# Patient Record
Sex: Male | Born: 1955 | Race: White | Hispanic: No | Marital: Married | Smoking: Never smoker
Health system: Southern US, Community
[De-identification: ages and names within clinical notes are randomized; demographics above are authoritative.]

## PROBLEM LIST (undated history)

## (undated) DIAGNOSIS — E119 Type 2 diabetes mellitus without complications: Secondary | ICD-10-CM

## (undated) DIAGNOSIS — I10 Essential (primary) hypertension: Secondary | ICD-10-CM

---

## 2008-12-25 ENCOUNTER — Emergency Department: Payer: Self-pay | Admitting: Emergency Medicine

## 2012-06-13 ENCOUNTER — Ambulatory Visit: Payer: Self-pay

## 2016-09-14 ENCOUNTER — Encounter: Payer: Self-pay | Admitting: Emergency Medicine

## 2016-09-14 ENCOUNTER — Emergency Department: Payer: 59

## 2016-09-14 ENCOUNTER — Emergency Department
Admission: EM | Admit: 2016-09-14 | Discharge: 2016-09-14 | Disposition: A | Discharge status: Home / Self Care | Payer: 59 | Attending: Emergency Medicine | Admitting: Emergency Medicine

## 2016-09-14 DIAGNOSIS — M7989 Other specified soft tissue disorders: Secondary | ICD-10-CM | POA: Insufficient documentation

## 2016-09-14 DIAGNOSIS — Y9389 Activity, other specified: Secondary | ICD-10-CM | POA: Diagnosis not present

## 2016-09-14 DIAGNOSIS — Y92480 Sidewalk as the place of occurrence of the external cause: Secondary | ICD-10-CM | POA: Diagnosis not present

## 2016-09-14 DIAGNOSIS — E119 Type 2 diabetes mellitus without complications: Secondary | ICD-10-CM | POA: Diagnosis not present

## 2016-09-14 DIAGNOSIS — S20211A Contusion of right front wall of thorax, initial encounter: Secondary | ICD-10-CM | POA: Insufficient documentation

## 2016-09-14 DIAGNOSIS — Y999 Unspecified external cause status: Secondary | ICD-10-CM | POA: Insufficient documentation

## 2016-09-14 DIAGNOSIS — I10 Essential (primary) hypertension: Secondary | ICD-10-CM | POA: Insufficient documentation

## 2016-09-14 DIAGNOSIS — S299XXA Unspecified injury of thorax, initial encounter: Secondary | ICD-10-CM | POA: Diagnosis present

## 2016-09-14 HISTORY — DX: Type 2 diabetes mellitus without complications: E11.9

## 2016-09-14 HISTORY — DX: Essential (primary) hypertension: I10

## 2016-09-14 NOTE — Discharge Instructions (Signed)
Follow-up with your primary care doctor if any continued problems. You may also use ice to sore areas especially your wrist. Take over-the-counter anti-inflammatories as needed for inflammation and pain.
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
## 2018-12-03 ENCOUNTER — Ambulatory Visit
Admission: EM | Admit: 2018-12-03 | Discharge: 2018-12-03 | Disposition: A | Discharge status: Home / Self Care | Payer: 59 | Attending: Family Medicine | Admitting: Family Medicine

## 2018-12-03 DIAGNOSIS — K047 Periapical abscess without sinus: Secondary | ICD-10-CM | POA: Diagnosis not present

## 2018-12-03 DIAGNOSIS — K0889 Other specified disorders of teeth and supporting structures: Secondary | ICD-10-CM

## 2018-12-03 MED ORDER — AMOXICILLIN 875 MG PO TABS: 875.0000 mg | ORAL_TABLET | Freq: Two times a day (BID) | ORAL | 0 refills | Status: AC

## 2018-12-03 NOTE — ED Provider Notes (Signed)
[facility] ____________________________________________  Time seen: Approximately 10:46 AM  I have reviewed the triage vital signs and the nursing notes.   HISTORY  Chief Complaint Dental Pain    HPI Samuel Pruitt is a 63 y.o. male presenting for evaluation of dental tenderness present for the last 2 days.  States he noticed some swelling at the lower gumline that is tender, concerning him for infection.  Denies injury.  Reports he has some dental caries, but reports this was occurring near a dental crown.  Denies accompanying fevers, cough, congestion or recent sickness.  Continues to overall eat and drink well.  Has not taken any medications for the same complaint.  Denies aggravating factors.   Past Medical History:  Diagnosis Date  . Diabetes mellitus without complication (HCC)   . Hypertension     There are no active problems to display for this patient.   History reviewed. No pertinent surgical history.  Current Outpatient Rx  . Order #: [id number] Class: Historical Med  . Order #: [id number] Class: Historical Med  . Order #: [id number] Class: Historical Med  . Order #: [id number] Class: Historical Med  . Order #: [id number] Class: Historical Med  . Order #: [id number] Class: Normal    Allergies Other  Family History  Problem Relation Age of Onset  . Cancer Mother   . Cancer Father     Social History Social History   Tobacco Use  . Smoking status: Never Smoker  . Smokeless tobacco: Never Used  Substance Use Topics  . Alcohol use: Never    Frequency: Never  . Drug use: Not on file    Review of Systems Constitutional: No fever/chills. Reports continues to eat and drink foods and fluids well.  ENT: No sore throat. Cardiovascular: Denies chest pain. Respiratory: Denies shortness of breath. Gastrointestinal: No abdominal pain.  No nausea, no vomiting.  Skin: Negative for rash.  ____________________________________________   PHYSICAL EXAM:  VITAL SIGNS: ED Triage Vitals  Enc Vitals Group     BP 12/03/18 1016 130/75     Pulse Rate 12/03/18 1016 (!) 109     Resp 12/03/18 1016 18     Temp 12/03/18 1016 98.1 F (36.7 C)     Temp Source 12/03/18 1016 Oral     SpO2 12/03/18 1016 98 %     Weight 12/03/18 1018 242 lb (109.8 kg)     Height 12/03/18 1018 5\' 9"  (1.753 m)     Head Circumference --      Peak Flow --      Pain Score 12/03/18 1018 0     Pain Loc --      Pain Edu? --      Excl. in GC? --     Constitutional: Alert and oriented. Well appearing and in no acute distress. Eyes: Conjunctivae are normal.  Head: Atraumatic.  No edema or induration noted. Nose: No congestion Mouth/Throat: Mucous membranes are moist.  Oropharynx non-erythematous. Periodontal Exam   Dental tenderness and gumline erythema and mild swelling beneath tooth 29 and 30, no drainage, no pointing, no other tenderness noted.  Several dental caries. Neck: No stridor.  Hematological/Lymphatic/Immunilogical: No cervical lymphadenopathy. Cardiovascular:   Normal rate, regular rhythm. Grossly normal heart sounds. Good peripheral circulation. Respiratory: Normal respiratory effort.  No retractions. Musculoskeletal: Steady gait Neurologic:  Normal speech and language.  Speech is normal. No gait instability. Skin:  Skin is warm, dry and intact. No rash noted. Psychiatric: Mood and affect are normal. Speech and behavior are normal.  ____________________________________________  LABS (all labs ordered are listed, but only abnormal results are displayed)  Labs Reviewed - No data to display ____________________________________________    INITIAL IMPRESSION / ASSESSMENT AND PLAN / ED COURSE  Pertinent labs & imaging results that were available during my care of the patient were reviewed by me and considered in my medical decision making (see chart for details).  Well-appearing patient.  No acute distress.  Presenting for evaluation of dental  tenderness, suspect secondary dental infection.  Will treat with oral amoxicillin.  Discussed frequent mouth rinsing, over-the-counter Tylenol ibuprofen as needed.  Follow-up with dentist soon as possible.Discussed indication, risks and benefits of medications with patient.  Patient was advised to see the dentist within 10 days. Also advised to take the antibiotic until finished. Instructed to return to the Urgent Care or ER  for worsening concerns. ____________________________________________   FINAL CLINICAL IMPRESSION(S) / ED DIAGNOSES  Final diagnoses:  Dental infection  Pain, dental         Renford Dills, NP 12/03/18 1050

## 2018-12-03 NOTE — Discharge Instructions (Addendum)
Take medication as prescribed. Rest. Drink plenty of fluids. Frequent mouth rinses.   Follow up with dentist as soon as possible.   Follow up with your primary care physician this week as needed. Return to Urgent care for new or worsening concerns.

## 2018-12-03 NOTE — ED Triage Notes (Signed)
Pt here for abscess tooth for 2 days and states it is painful and under his crown. Happens to be his bottom front tooth.
